# Patient Record
Sex: Female | Born: 2000
Health system: Southern US, Community
[De-identification: ages and names within clinical notes are randomized; demographics above are authoritative.]

---
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
## 2020-02-04 DIAGNOSIS — J0140 Acute pansinusitis, unspecified: Secondary | ICD-10-CM | POA: Diagnosis not present

## 2020-03-02 DIAGNOSIS — D2362 Other benign neoplasm of skin of left upper limb, including shoulder: Secondary | ICD-10-CM | POA: Diagnosis not present

## 2020-03-02 DIAGNOSIS — B078 Other viral warts: Secondary | ICD-10-CM | POA: Diagnosis not present

## 2020-04-03 ENCOUNTER — Encounter (HOSPITAL_COMMUNITY): Payer: Self-pay | Admitting: Emergency Medicine

## 2020-04-03 ENCOUNTER — Ambulatory Visit (HOSPITAL_COMMUNITY)
Admission: EM | Admit: 2020-04-03 | Discharge: 2020-04-03 | Disposition: A | Discharge status: Home / Self Care | Payer: BC Managed Care – PPO | Attending: Internal Medicine | Admitting: Internal Medicine

## 2020-04-03 ENCOUNTER — Other Ambulatory Visit: Payer: Self-pay

## 2020-04-03 DIAGNOSIS — M25561 Pain in right knee: Secondary | ICD-10-CM | POA: Diagnosis not present

## 2020-04-03 MED ORDER — NAPROXEN 500 MG PO TBEC: 500.0000 mg | DELAYED_RELEASE_TABLET | Freq: Two times a day (BID) | ORAL | 0 refills | Status: AC | PRN

## 2020-04-03 NOTE — ED Provider Notes (Signed)
[facility]    CSN: [id number] Arrival date & time: 04/03/20  1742    History   Chief Complaint Chief Complaint  Patient presents with  . Knee Pain    HPI Tricia Stanley is a 19 y.o. female otherwise healthy who presents with right-sided knee pain.  Patient states she began running on treadmill approximately 10 days ago and is now experiencing pain in the right knee.  Patient states she had taken some time off her running prior to restarting.  Pain on lateral aspect of knee and below.  She denies any joint swelling or instability, no popping.  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    History reviewed. No pertinent past medical history.  There are no problems to display for this patient.   History reviewed. No pertinent surgical history.  OB History   No obstetric history on file.      Home Medications    Prior to Admission medications   Medication Sig Start Date End Date Taking? Authorizing Provider  acetaminophen-codeine (TYLENOL #3) 300-30 MG per tablet Take 1 tablet by mouth every 6 (six) hours as needed for pain. 09/28/12   Moreno-Coll, Adlih, MD  naproxen (EC NAPROSYN) 500 MG EC tablet Take 1 tablet (500 mg total) by mouth 2 (two) times daily as needed. 04/03/20 04/03/21  Rolla Etienne, NP    Family History Family History  Problem Relation Age of Onset  . Healthy Mother     Social History Social History   Tobacco Use  . Smoking status: Not on file  Substance Use Topics  . Alcohol use: Not on file  . Drug use: Not on file     Allergies   Patient has no known allergies.   Review of Systems As stated in HPI otherwise negative   Physical Exam Triage Vital Signs ED Triage Vitals  Enc Vitals Group     BP 04/03/20 1813 132/81     Pulse Rate 04/03/20 1813 92     Resp 04/03/20 1813 18     Temp 04/03/20 1813 97.9 F (36.6 C)     Temp Source 04/03/20 1813  Oral     SpO2 04/03/20 1813 100 %     Weight --      Height --      Head Circumference --      Peak Flow --      Pain Score 04/03/20 1814 6     Pain Loc --      Pain Edu? --      Excl. in GC? --    No data found.  Updated Vital Signs BP 132/81 (BP Location: Right Arm)   Pulse 92   Temp 97.9 F (36.6 C) (Oral)   Resp 18   SpO2 100%      Physical Exam Constitutional:      Appearance: Normal appearance. She is normal weight.  Musculoskeletal:        General: Swelling and tenderness present. Normal range of motion.     Comments: Right knee normal ROM, no crepitus or popping.  No joint instability.  Tenderness upon palpation of lateral knee with mild amount of swelling  Neurological:     Mental Status: She is alert.      UC Treatments / Results  Labs (all labs ordered are listed, but only abnormal results are displayed) Labs Reviewed - No data to display  EKG  Radiology No results found.  Procedures Procedures (including critical care time)  Medications Ordered in UC Medications - No data to display  Initial Impression / Assessment and Plan / UC Course  I have reviewed the triage vital signs and the nursing notes.  Pertinent labs & imaging results that were available during my care of the patient were reviewed by me and considered in my medical decision making (see chart for details).  IT band syndrome -Exam consistent with IT band inflammation -Supportive care with compression sleeve, ice, NSAIDs, rest -Follow-up as needed  Final Clinical Impressions(s) / UC Diagnoses   Final diagnoses:  Acute pain of right knee     Discharge Instructions     Rest Ice Compression Elevation Take Naproxen as needed for pain.    ED Prescriptions    Medication Sig Dispense Auth. Provider   naproxen (EC NAPROSYN) 500 MG EC tablet Take 1 tablet (500 mg total) by mouth 2 (two) times daily as needed. 30 tablet Rudolpho Sevin, NP     PDMP not reviewed this  encounter.   Rudolpho Sevin, NP 04/03/20 1846

## 2020-04-03 NOTE — Discharge Instructions (Addendum)
Rest Ice Compression Elevation Take Naproxen as needed for pain.

## 2020-04-03 NOTE — ED Triage Notes (Signed)
Pt here for right knee pain starting while running 10 days ago

## 2020-06-10 DIAGNOSIS — S90424A Blister (nonthermal), right lesser toe(s), initial encounter: Secondary | ICD-10-CM | POA: Diagnosis not present

## 2020-06-10 DIAGNOSIS — S90425A Blister (nonthermal), left lesser toe(s), initial encounter: Secondary | ICD-10-CM | POA: Diagnosis not present

## 2020-07-01 DIAGNOSIS — J029 Acute pharyngitis, unspecified: Secondary | ICD-10-CM | POA: Diagnosis not present

## 2020-07-06 DIAGNOSIS — J020 Streptococcal pharyngitis: Secondary | ICD-10-CM | POA: Diagnosis not present

## 2020-10-22 DIAGNOSIS — Z03818 Encounter for observation for suspected exposure to other biological agents ruled out: Secondary | ICD-10-CM | POA: Diagnosis not present

## 2020-10-22 DIAGNOSIS — Z20822 Contact with and (suspected) exposure to covid-19: Secondary | ICD-10-CM | POA: Diagnosis not present

## 2020-10-22 DIAGNOSIS — J029 Acute pharyngitis, unspecified: Secondary | ICD-10-CM | POA: Diagnosis not present

## 2021-02-05 DIAGNOSIS — H6122 Impacted cerumen, left ear: Secondary | ICD-10-CM | POA: Diagnosis not present

## 2023-05-02 ENCOUNTER — Encounter (HOSPITAL_COMMUNITY): Payer: Self-pay | Admitting: Emergency Medicine

## 2023-05-02 ENCOUNTER — Ambulatory Visit (HOSPITAL_COMMUNITY)
Admission: EM | Admit: 2023-05-02 | Discharge: 2023-05-02 | Disposition: A | Discharge status: Home / Self Care | Payer: BC Managed Care – PPO

## 2023-05-02 DIAGNOSIS — Z209 Contact with and (suspected) exposure to unspecified communicable disease: Secondary | ICD-10-CM

## 2023-05-02 DIAGNOSIS — Z23 Encounter for immunization: Secondary | ICD-10-CM

## 2023-05-02 MED ORDER — RABIES IMMUNE GLOBULIN 150 UNIT/ML IM INJ
20.0000 [IU]/kg | INJECTION | Freq: Once | INTRAMUSCULAR | Status: AC
  Administered 2023-05-02: 1200 [IU]

## 2023-05-02 MED ORDER — RABIES VACCINE, PCEC IM SUSR
1.0000 mL | Freq: Once | INTRAMUSCULAR | Status: AC
  Administered 2023-05-02: 1 mL via INTRAMUSCULAR

## 2023-05-02 MED ORDER — RABIES IMMUNE GLOBULIN 150 UNIT/ML IM INJ
INJECTION | INTRAMUSCULAR | Status: AC
  Filled 2023-05-02: qty 10

## 2023-05-02 MED ORDER — RABIES VACCINE, PCEC IM SUSR
INTRAMUSCULAR | Status: AC
  Filled 2023-05-02: qty 1

## 2023-05-02 NOTE — ED Provider Notes (Signed)
[facility]    CSN: [id number] Arrival date & time: 05/02/23  1753      History   Chief Complaint Chief Complaint  Patient presents with   bat exposure    HPI Tricia Stanley is a 22 y.o. female.   HPI She is in today for evaluation.  She reports that she has been in a home where that stands.  She denies any known bites.  She is in today for precautions.  She is very anxious and nervous about whether she should be vaccinated. History reviewed. No pertinent past medical history.  There are no problems to display for this patient.   History reviewed. No pertinent surgical history.  OB History   No obstetric history on file.      Home Medications    Prior to Admission medications   Medication Sig Start Date End Date Taking? Authorizing Provider  acetaminophen-codeine (TYLENOL #3) 300-30 MG per tablet Take 1 tablet by mouth every 6 (six) hours as needed for pain. 09/28/12   Moreno-Coll, Adlih, MD  JUNEL FE 1/20 1-20 MG-MCG tablet Take 1 tablet by mouth daily.    [provider]    Family History Family History  Problem Relation Age of Onset   Healthy Mother     Social History     Allergies   Patient has no known allergies.   Review of Systems Review of Systems   Physical Exam Triage Vital Signs ED Triage Vitals  Encounter Vitals Group     BP 05/02/23 1830 134/88     Systolic BP Percentile --      Diastolic BP Percentile --      Pulse Rate 05/02/23 1830 64     Resp 05/02/23 1830 16     Temp 05/02/23 1830 98.1 F (36.7 C)     Temp src --      SpO2 05/02/23 1830 98 %     Weight --      Height --      Head Circumference --      Peak Flow --      Pain Score 05/02/23 1829 0     Pain Loc --      Pain Education --      Exclude from Growth Chart --    No data found.  Updated Vital Signs BP 134/88 (BP Location: Right Arm)   Pulse 64   Temp 98.1 F (36.7 C)   Resp 16   Wt 129 lb 12.8 oz (58.9 kg)   LMP 05/02/2023   SpO2  98%   Visual Acuity Right Eye Distance:   Left Eye Distance:   Bilateral Distance:    Right Eye Near:   Left Eye Near:    Bilateral Near:     Physical Exam Constitutional:      General: She is not in acute distress.    Appearance: She is normal weight. She is not ill-appearing.  HENT:     Head: Normocephalic and atraumatic.     Nose: Nose normal.     Mouth/Throat:     Mouth: Mucous membranes are moist.  Eyes:     Pupils: Pupils are equal, round, and reactive to light.  Cardiovascular:     Rate and Rhythm: Normal rate.  Pulmonary:     Effort: Pulmonary effort is normal.  Musculoskeletal:        General: Normal range of motion.  Skin:    General: Skin is warm and dry.  Capillary Refill: Capillary refill takes less than 2 seconds.  Neurological:     General: No focal deficit present.     Mental Status: She is alert and oriented to person, place, and time.  Psychiatric:        Mood and Affect: Mood normal.      UC Treatments / Results  Labs (all labs ordered are listed, but only abnormal results are displayed) Labs Reviewed - No data to display  EKG   Radiology No results found.  Procedures Procedures (including critical care time)  Medications Ordered in UC Medications  rabies immune globulin (HYPERRAB/KEDRAB) injection 1,200 Units (1,200 Units Infiltration Given 05/02/23 1930)  rabies vaccine (RABAVERT) injection 1 mL (1 mL Intramuscular Given 05/02/23 1934)    Initial Impression / Assessment and Plan / UC Course  I have reviewed the triage vital signs and the nursing notes.  Pertinent labs & imaging results that were available during my care of the patient were reviewed by me and considered in my medical decision making (see chart for details).     Been exposed Final Clinical Impressions(s) / UC Diagnoses   Final diagnoses:  Exposure to bat without known bite     Discharge Instructions      You have been treated for possible  exposure  Please return to urgent care on days 3, 7, and 14 for rabies series. It is very important that you return to receive the rest of the series to prevent rabies infection. Day 3: May 05, 2023 Day 7: May 09, 2023 Day 14: May 16, 2023 Return here or go to the Emergency Department if you experience worsening or uncontrolled pain, spreading redness, fevers 100.4 or greater, pus from your bite, or for any other concerning symptoms.      ED Prescriptions   None    PDMP not reviewed this encounter.   Thad Ranger [place], [place] 05/02/23 1956

## 2023-05-02 NOTE — Discharge Instructions (Addendum)
You have been treated for possible exposure  Please return to urgent care on days 3, 7, and 14 for rabies series. It is very important that you return to receive the rest of the series to prevent rabies infection. Day 3: May 05, 2023 Day 7: May 09, 2023 Day 14: May 16, 2023 Return here or go to the Emergency Department if you experience worsening or uncontrolled pain, spreading redness, fevers 100.4 or greater, pus from your bite, or for any other concerning symptoms.

## 2023-05-02 NOTE — ED Triage Notes (Addendum)
Pt reports she wasn't home when mother found a bat on the floor in their house. Pt has no known exposure to the bat that she knows of. Pt is anxious about rabies and would like to discuss with provider vaccinations.

## 2023-05-05 ENCOUNTER — Ambulatory Visit (HOSPITAL_COMMUNITY)
Admission: EM | Admit: 2023-05-05 | Discharge: 2023-05-05 | Disposition: A | Discharge status: Home / Self Care | Payer: BC Managed Care – PPO | Attending: Emergency Medicine | Admitting: Emergency Medicine

## 2023-05-05 ENCOUNTER — Encounter (HOSPITAL_COMMUNITY): Payer: Self-pay

## 2023-05-05 DIAGNOSIS — Z203 Contact with and (suspected) exposure to rabies: Secondary | ICD-10-CM | POA: Diagnosis not present

## 2023-05-05 MED ORDER — RABIES VACCINE, PCEC IM SUSR
1.0000 mL | Freq: Once | INTRAMUSCULAR | Status: AC
  Administered 2023-05-05: 1 mL via INTRAMUSCULAR

## 2023-05-05 NOTE — ED Triage Notes (Signed)
Pt presents for rabies injection Day 3. Tolerated well.

## 2023-05-09 ENCOUNTER — Ambulatory Visit (HOSPITAL_COMMUNITY)
Admission: EM | Admit: 2023-05-09 | Discharge: 2023-05-09 | Disposition: A | Discharge status: Home / Self Care | Payer: BC Managed Care – PPO | Attending: Internal Medicine | Admitting: Internal Medicine

## 2023-05-09 DIAGNOSIS — Z203 Contact with and (suspected) exposure to rabies: Secondary | ICD-10-CM | POA: Diagnosis not present

## 2023-05-09 MED ORDER — RABIES VACCINE, PCEC IM SUSR
1.0000 mL | Freq: Once | INTRAMUSCULAR | Status: AC
  Administered 2023-05-09: 1 mL via INTRAMUSCULAR

## 2023-05-09 MED ORDER — RABIES VACCINE, PCEC IM SUSR
INTRAMUSCULAR | Status: AC
  Filled 2023-05-09: qty 1

## 2023-05-09 NOTE — ED Triage Notes (Signed)
Pt here for day 7 rabies vaccine 

## 2023-05-16 ENCOUNTER — Ambulatory Visit (HOSPITAL_COMMUNITY)
Admission: EM | Admit: 2023-05-16 | Discharge: 2023-05-16 | Disposition: A | Discharge status: Home / Self Care | Payer: BC Managed Care – PPO | Attending: Internal Medicine | Admitting: Internal Medicine

## 2023-05-16 DIAGNOSIS — Z209 Contact with and (suspected) exposure to unspecified communicable disease: Secondary | ICD-10-CM

## 2023-05-16 DIAGNOSIS — Z203 Contact with and (suspected) exposure to rabies: Secondary | ICD-10-CM

## 2023-05-16 MED ORDER — RABIES VACCINE, PCEC IM SUSR
1.0000 mL | Freq: Once | INTRAMUSCULAR | Status: AC
  Administered 2023-05-16: 1 mL via INTRAMUSCULAR

## 2023-05-16 MED ORDER — RABIES VACCINE, PCEC IM SUSR
INTRAMUSCULAR | Status: AC
  Filled 2023-05-16: qty 1

## 2023-05-16 NOTE — ED Triage Notes (Signed)
Pt here today for 14 day rabies vaccine

## 2023-10-22 ENCOUNTER — Ambulatory Visit (HOSPITAL_COMMUNITY)
Admission: EM | Admit: 2023-10-22 | Discharge: 2023-10-22 | Disposition: A | Discharge status: Home / Self Care | Payer: BC Managed Care – PPO | Attending: Physician Assistant | Admitting: Physician Assistant

## 2023-10-22 ENCOUNTER — Encounter (HOSPITAL_COMMUNITY): Payer: Self-pay | Admitting: Emergency Medicine

## 2023-10-22 DIAGNOSIS — J101 Influenza due to other identified influenza virus with other respiratory manifestations: Secondary | ICD-10-CM

## 2023-10-22 LAB — POC COVID19/FLU A&B COMBO
Covid Antigen, POC: NEGATIVE
Influenza A Antigen, POC: POSITIVE — AB
Influenza B Antigen, POC: NEGATIVE

## 2023-10-22 MED ORDER — ACETAMINOPHEN 325 MG PO TABS
ORAL_TABLET | ORAL | Status: AC
  Filled 2023-10-22: qty 2

## 2023-10-22 MED ORDER — ACETAMINOPHEN 325 MG PO TABS
650.0000 mg | ORAL_TABLET | Freq: Once | ORAL | Status: AC
  Administered 2023-10-22: 650 mg via ORAL

## 2023-10-22 MED ORDER — OSELTAMIVIR PHOSPHATE 75 MG PO CAPS: 75.0000 mg | ORAL_CAPSULE | Freq: Two times a day (BID) | ORAL | 0 refills | Status: AC

## 2023-10-22 NOTE — ED Provider Notes (Signed)
 [facility]   MRN: [id number] DOB: [birth year]  Subjective:   Tricia Stanley is a 23 y.o. female presenting for fever x 1 day.  Patient reports that she started feeling body aches and headache, as well as low back pain last night.  Started with fever this morning.  She has had some sore throat and congestion this week.  Denies any recent travel.  No sick contacts.  She had her flu vaccine.  No other symptoms reported at this time.  No current facility-administered medications for this encounter.  Current Outpatient Medications:    oseltamivir  (TAMIFLU ) 75 MG capsule, Take 1 capsule (75 mg total) by mouth 2 (two) times daily for 5 days., Disp: 10 capsule, Rfl: 0   acetaminophen -codeine  (TYLENOL  #3) 300-30 MG per tablet, Take 1 tablet by mouth every 6 (six) hours as needed for pain., Disp: 20 tablet, Rfl: 0   JUNEL FE 1/20 1-20 MG-MCG tablet, Take 1 tablet by mouth daily., Disp: , Rfl:    No Known Allergies  History reviewed. No pertinent past medical history.   History reviewed. No pertinent surgical history.  Family History  Problem Relation Age of Onset   Healthy Mother     Social History   Tobacco Use   Smoking status: Never   Smokeless tobacco: Never    ROS REFER TO HPI FOR PERTINENT POSITIVES AND NEGATIVES   Objective:   Vitals: BP 116/71 (BP Location: Right Arm)   Pulse (!) 128   Temp (!) 101.1 F (38.4 C) (Oral)   Resp 16   LMP 10/03/2023   SpO2 98%   Physical Exam Vitals and nursing note reviewed.  Constitutional:      General: She is not in acute distress.    Appearance: Normal appearance. She is ill-appearing and diaphoretic.  HENT:     Head: Normocephalic.     Right Ear: Tympanic membrane, ear canal and external ear normal.     Left Ear: Tympanic membrane, ear canal and external ear normal.     Nose: Congestion present.     Mouth/Throat:     Mouth: Mucous membranes are moist. No oral lesions.     Pharynx: Uvula midline. No  pharyngeal swelling, oropharyngeal exudate, posterior oropharyngeal erythema or uvula swelling.  Eyes:     Extraocular Movements: Extraocular movements intact.     Conjunctiva/sclera: Conjunctivae normal.     Pupils: Pupils are equal, round, and reactive to light.  Cardiovascular:     Rate and Rhythm: Normal rate and regular rhythm.     Pulses: Normal pulses.     Heart sounds: Normal heart sounds. No murmur heard. Pulmonary:     Effort: Pulmonary effort is normal. No respiratory distress.     Breath sounds: Normal breath sounds. No wheezing.  Abdominal:     General: There is no distension.     Palpations: Abdomen is soft.     Tenderness: There is no abdominal tenderness. There is no rebound.  Musculoskeletal:     Cervical back: Normal range of motion.  Skin:    General: Skin is warm.  Neurological:     Mental Status: She is alert and oriented to person, place, and time.  Psychiatric:        Mood and Affect: Mood normal.        Behavior: Behavior normal.     Results for orders placed or performed during the hospital encounter of 10/22/23 (from the past 24 hours)  POC Covid19/Flu A&B  Antigen     Status: Abnormal   Collection Time: 10/22/23 12:30 PM  Result Value Ref Range   Influenza A Antigen, POC Positive (A) Negative   Influenza B Antigen, POC Negative Negative   Covid Antigen, POC Negative Negative    Assessment and Plan :   PDMP not reviewed this encounter.  1. Influenza A    Flu A positive. Tamiflu  as directed. Tylenol  given during visit, pt felt better during her stay after the Tylenol . Supportive care at home including rest and fluids.  RTC precautions discussed. Pt understanding and agreeable.    AllwardtMardy HERO, PA-C 10/22/23 1248

## 2023-10-22 NOTE — ED Triage Notes (Signed)
 Pt reports sore throat and nasal drip since Tuesday. Today woke up with cough "looser", sinus pain/headache and lower back and hip pains. Hasn't taken medications for symptoms.

## 2023-10-22 NOTE — Discharge Instructions (Signed)
 Good to meet you today.  Your test was positive for influenza A virus.  You may take the Tamiflu  as directed to help shorten the course of your symptoms.  Be sure to rest and hydrate well.  You may take Tylenol  or ibuprofen to help with fever and bodyaches.  Return to care if any worsening or change in symptoms.
